# Patient Record
Sex: Female | Born: 1995 | Race: White | Hispanic: No | Marital: Single | Smoking: Former smoker
Health system: Southern US, Community
[De-identification: ages and names within clinical notes are randomized; demographics above are authoritative.]

## PROBLEM LIST (undated history)

## (undated) DIAGNOSIS — N200 Calculus of kidney: Secondary | ICD-10-CM

## (undated) DIAGNOSIS — J45909 Unspecified asthma, uncomplicated: Secondary | ICD-10-CM

## (undated) DIAGNOSIS — D649 Anemia, unspecified: Secondary | ICD-10-CM

## (undated) DIAGNOSIS — F419 Anxiety disorder, unspecified: Secondary | ICD-10-CM

## (undated) DIAGNOSIS — R001 Bradycardia, unspecified: Secondary | ICD-10-CM

## (undated) HISTORY — DX: Unspecified asthma, uncomplicated: J45.909

## (undated) HISTORY — DX: Bradycardia, unspecified: R00.1

## (undated) HISTORY — DX: Anemia, unspecified: D64.9

## (undated) HISTORY — DX: Calculus of kidney: N20.0

## (undated) HISTORY — PX: URETERAL STENT PLACEMENT: SHX822

---

## 2018-09-15 HISTORY — PX: TUBAL LIGATION: SHX77

## 2019-09-11 ENCOUNTER — Emergency Department (HOSPITAL_COMMUNITY): Payer: Medicaid Other

## 2019-09-11 ENCOUNTER — Emergency Department (HOSPITAL_COMMUNITY)
Admission: EM | Admit: 2019-09-11 | Discharge: 2019-09-11 | Disposition: A | Discharge status: Home / Self Care | Payer: Medicaid Other | Attending: Emergency Medicine | Admitting: Emergency Medicine

## 2019-09-11 ENCOUNTER — Encounter (HOSPITAL_COMMUNITY): Payer: Self-pay | Admitting: Emergency Medicine

## 2019-09-11 ENCOUNTER — Other Ambulatory Visit: Payer: Self-pay

## 2019-09-11 DIAGNOSIS — R197 Diarrhea, unspecified: Secondary | ICD-10-CM | POA: Insufficient documentation

## 2019-09-11 DIAGNOSIS — R112 Nausea with vomiting, unspecified: Secondary | ICD-10-CM | POA: Diagnosis not present

## 2019-09-11 DIAGNOSIS — R102 Pelvic and perineal pain: Secondary | ICD-10-CM

## 2019-09-11 DIAGNOSIS — R195 Other fecal abnormalities: Secondary | ICD-10-CM | POA: Diagnosis not present

## 2019-09-11 DIAGNOSIS — R1084 Generalized abdominal pain: Secondary | ICD-10-CM | POA: Diagnosis present

## 2019-09-11 HISTORY — DX: Anxiety disorder, unspecified: F41.9

## 2019-09-11 LAB — CBC WITH DIFFERENTIAL/PLATELET
Abs Immature Granulocytes: 0.08 10*3/uL — ABNORMAL HIGH (ref 0.00–0.07)
Basophils Absolute: 0 10*3/uL (ref 0.0–0.1)
Basophils Relative: 0 %
Eosinophils Absolute: 0 10*3/uL (ref 0.0–0.5)
Eosinophils Relative: 0 %
HCT: 36 % (ref 36.0–46.0)
Hemoglobin: 11.1 g/dL — ABNORMAL LOW (ref 12.0–15.0)
Immature Granulocytes: 1 %
Lymphocytes Relative: 5 %
Lymphs Abs: 0.7 10*3/uL (ref 0.7–4.0)
MCH: 24 pg — ABNORMAL LOW (ref 26.0–34.0)
MCHC: 30.8 g/dL (ref 30.0–36.0)
MCV: 77.9 fL — ABNORMAL LOW (ref 80.0–100.0)
Monocytes Absolute: 0.5 10*3/uL (ref 0.1–1.0)
Monocytes Relative: 3 %
Neutro Abs: 13 10*3/uL — ABNORMAL HIGH (ref 1.7–7.7)
Neutrophils Relative %: 91 %
Platelets: 551 10*3/uL — ABNORMAL HIGH (ref 150–400)
RBC: 4.62 MIL/uL (ref 3.87–5.11)
RDW: 16.7 % — ABNORMAL HIGH (ref 11.5–15.5)
WBC: 14.2 10*3/uL — ABNORMAL HIGH (ref 4.0–10.5)
nRBC: 0 % (ref 0.0–0.2)

## 2019-09-11 LAB — COMPREHENSIVE METABOLIC PANEL
ALT: 15 U/L (ref 0–44)
AST: 18 U/L (ref 15–41)
Albumin: 4 g/dL (ref 3.5–5.0)
Alkaline Phosphatase: 82 U/L (ref 38–126)
Anion gap: 12 (ref 5–15)
BUN: 10 mg/dL (ref 6–20)
CO2: 20 mmol/L — ABNORMAL LOW (ref 22–32)
Calcium: 8.6 mg/dL — ABNORMAL LOW (ref 8.9–10.3)
Chloride: 109 mmol/L (ref 98–111)
Creatinine, Ser: 0.89 mg/dL (ref 0.44–1.00)
GFR calc Af Amer: >60 mL/min (ref >60)
GFR calc non Af Amer: >60 mL/min (ref >60)
Glucose, Bld: 114 mg/dL — ABNORMAL HIGH (ref 70–99)
Potassium: 3.8 mmol/L (ref 3.5–5.1)
Sodium: 141 mmol/L (ref 135–145)
Total Bilirubin: 0.6 mg/dL (ref 0.3–1.2)
Total Protein: 7.4 g/dL (ref 6.5–8.1)

## 2019-09-11 LAB — URINALYSIS, ROUTINE W REFLEX MICROSCOPIC
Bilirubin Urine: NEGATIVE
Glucose, UA: NEGATIVE mg/dL
Ketones, ur: 20 mg/dL — AB
Leukocytes,Ua: NEGATIVE
Nitrite: NEGATIVE
Protein, ur: 30 mg/dL — AB
Specific Gravity, Urine: >1.046 — ABNORMAL HIGH (ref 1.005–1.030)
pH: 7 (ref 5.0–8.0)

## 2019-09-11 LAB — LIPASE, BLOOD: Lipase: 16 U/L (ref 11–51)

## 2019-09-11 LAB — POC OCCULT BLOOD, ED: Fecal Occult Bld: POSITIVE — AB

## 2019-09-11 LAB — I-STAT BETA HCG BLOOD, ED (MC, WL, AP ONLY): I-stat hCG, quantitative: <5 m[IU]/mL (ref <5)

## 2019-09-11 MED ORDER — IOHEXOL 300 MG/ML  SOLN
100.0000 mL | Freq: Once | INTRAMUSCULAR | Status: AC | PRN
  Administered 2019-09-11: 100 mL via INTRAVENOUS

## 2019-09-11 MED ORDER — ONDANSETRON HCL 4 MG/2ML IJ SOLN
4.0000 mg | Freq: Once | INTRAMUSCULAR | Status: AC
  Administered 2019-09-11: 4 mg via INTRAVENOUS
  Filled 2019-09-11: qty 2

## 2019-09-11 MED ORDER — PANTOPRAZOLE SODIUM 40 MG IV SOLR
40.0000 mg | Freq: Once | INTRAVENOUS | Status: AC
  Administered 2019-09-11: 40 mg via INTRAVENOUS
  Filled 2019-09-11: qty 40

## 2019-09-11 MED ORDER — ONDANSETRON 4 MG PO TBDP: 4.0000 mg | ORAL_TABLET | Freq: Three times a day (TID) | ORAL | 0 refills | Status: AC | PRN

## 2019-09-11 MED ORDER — MORPHINE SULFATE (PF) 4 MG/ML IV SOLN
4.0000 mg | Freq: Once | INTRAVENOUS | Status: AC
  Administered 2019-09-11: 4 mg via INTRAVENOUS
  Filled 2019-09-11: qty 1

## 2019-09-11 MED ORDER — SODIUM CHLORIDE 0.9 % IV BOLUS
1000.0000 mL | Freq: Once | INTRAVENOUS | Status: AC
  Administered 2019-09-11: 1000 mL via INTRAVENOUS

## 2019-09-11 NOTE — ED Provider Notes (Addendum)
[facility] EMERGENCY DEPARTMENT Provider Note   CSN: [id number] Arrival date & time: 09/11/19  1238   History Chief Complaint  Patient presents with  . Abdominal Pain   Ashley Sexton is a 24 y.o. female with past medical history significant for anemia, tubal ligation who presents for evaluation of abdominal pain and emesis.  Patient has had abdominal pain x2 days.  Described aching, sharp, stabbing.  Worse with any movement.  She has had multiple episodes of NBNB emesis.  States she has had dark and tarry stools over the last 2 days which is alternating constipation and diarrhea.  No prior history of GI bleed.  States she is very anxious.  Denies any alcohol or NSAID use.  Denies fever, chills, chest pain, shortness of breath, dysuria, chance of pregnancy, pelvic pain, vaginal discharge.  Denies additional aggravating or alleviating factors. No anticoagulation. Has never seen GI previously  History obtained from patient and past medical records. No interpretor was used.  HPI     Past Medical History:  Diagnosis Date  . Anxiety     There are no problems to display for this patient.   History reviewed   OB History   No obstetric history on file.     History reviewed. No pertinent family history.  Social History   Tobacco Use  . Smoking status: Unknown If Ever Smoked  Substance Use Topics  . Alcohol use: Yes  . Drug use: Yes    Frequency: 1.0 times per week    Types: Marijuana    Home Medications Prior to Admission medications   Not on File    Allergies    Patient has no known allergies.  Review of Systems   Review of Systems  Constitutional: Negative.   HENT: Negative.   Respiratory: Negative.   Cardiovascular: Negative.   Gastrointestinal: Positive for abdominal pain, blood in stool, constipation, diarrhea, nausea and vomiting. Negative for abdominal distention, anal bleeding and rectal pain.  Genitourinary: Negative.   Musculoskeletal:  Negative.   Skin: Negative.   Neurological: Negative.   All other systems reviewed and are negative.   Physical Exam Updated Vital Signs BP (!) 102/58   Pulse (!) 47   Temp 97.6 F (36.4 C) (Oral)   Resp 19   SpO2 95%   Physical Exam Vitals and nursing note reviewed. Exam conducted with a chaperone present.  Constitutional:      General: She is in acute distress.     Appearance: She is well-developed. She is not ill-appearing or toxic-appearing.  HENT:     Head: Normocephalic and atraumatic.  Eyes:     Pupils: Pupils are equal, round, and reactive to light.  Cardiovascular:     Rate and Rhythm: Tachycardia present.     Heart sounds: Normal heart sounds.  Pulmonary:     Effort: Pulmonary effort is normal. No respiratory distress.     Breath sounds: Normal breath sounds.     Comments: Speaks in full sentences without difficulty Abdominal:     General: Bowel sounds are normal. There is no distension.     Palpations: Abdomen is soft.     Tenderness: There is generalized abdominal tenderness. There is guarding. There is no right CVA tenderness, left CVA tenderness or rebound.     Hernia: No hernia is present.     Comments: Diffuse tenderness with guarding. No rebound.  Genitourinary:    Rectum: Guaiac result positive. No mass, tenderness, anal fissure or external hemorrhoid. Normal  anal tone.     Comments: No stool in rectal vault, old hemorrhoid skin tag.  No gross bright red blood per rectum or melena Musculoskeletal:        General: Normal range of motion.     Cervical back: Normal range of motion.     Comments: Moves all 4 extremities without difficulty  Skin:    General: Skin is warm and dry.     Capillary Refill: Capillary refill takes less than 2 seconds.     Comments: Mild pallor  Neurological:     Mental Status: She is alert.     ED Results / Procedures / Treatments   Labs (all labs ordered are listed, but only abnormal results are displayed) Labs Reviewed    CBC WITH DIFFERENTIAL/PLATELET - Abnormal; Notable for the following components:      Result Value   WBC 14.2 (*)    Hemoglobin 11.1 (*)    MCV 77.9 (*)    MCH 24.0 (*)    RDW 16.7 (*)    Platelets 551 (*)    Neutro Abs 13.0 (*)    Abs Immature Granulocytes 0.08 (*)    All other components within normal limits  COMPREHENSIVE METABOLIC PANEL - Abnormal; Notable for the following components:   CO2 20 (*)    Glucose, Bld 114 (*)    Calcium 8.6 (*)    All other components within normal limits  POC OCCULT BLOOD, ED - Abnormal; Notable for the following components:   Fecal Occult Bld POSITIVE (*)    All other components within normal limits  LIPASE, BLOOD  URINALYSIS, ROUTINE W REFLEX MICROSCOPIC  I-STAT BETA HCG BLOOD, ED ([facility], [facility], [facility] ONLY)    EKG EKG Interpretation  Date/Time:  Thursday September 11 2019 12:44:35 EST Ventricular Rate:  119 PR Interval:    QRS Duration: 92 QT Interval:  335 QTC Calculation: 472 R Axis:   81 Text Interpretation: Sinus tachycardia Borderline Q waves in inferior leads Baseline wander in lead(s) V2 no prior Confirmed by Eber Hong ([id number]) on 09/11/2019 12:47:13 PM   Radiology CT Abdomen Pelvis W Contrast  Result Date: 09/11/2019 CLINICAL DATA:  Generalized acute mid abdominal pain. Neutropenia. EXAM: CT ABDOMEN AND PELVIS WITH CONTRAST TECHNIQUE: Multidetector CT imaging of the abdomen and pelvis was performed using the standard protocol following bolus administration of intravenous contrast. CONTRAST:  OMNIPAQUE IOHEXOL 300 MG/ML  SOLN COMPARISON:  07/13/2019 CT abdomen/pelvis. FINDINGS: Lower chest: Mild patchy ground-glass opacities at right greater than left lung bases. Hepatobiliary: Persistent mild hepatomegaly, unchanged. No liver masses. No definite liver surface irregularity. Normal gallbladder with no radiopaque cholelithiasis. No biliary ductal dilatation. Pancreas: Normal, with no mass or duct dilation. Spleen: Normal size. No mass.  Adrenals/Urinary Tract: Normal adrenals. Normal kidneys with no hydronephrosis and no renal mass. Normal bladder. Stomach/Bowel: Normal non-distended stomach. Collapsed small bowel loops. No dilated small bowel loops. No small bowel wall thickening. Normal appendix. Collapsed large bowel. Apparent mild thickening of the ascending colonic wall. Otherwise normal large bowel wall thickness with no colonic diverticulosis. Vascular/Lymphatic: Normal caliber abdominal aorta. Patent portal, splenic, hepatic and renal veins. No pathologically enlarged lymph nodes in the abdomen or pelvis. Reproductive: Grossly normal uterus. Bilateral tubal ligation clips are in place. No adnexal mass. Other: No pneumoperitoneum, ascites or focal fluid collection. Musculoskeletal: No aggressive appearing focal osseous lesions. Mild degenerative disc disease in the lower thoracic spine. IMPRESSION: 1. Apparent mild thickening of the ascending colonic wall, which could be  due to underdistention, with a nonspecific mild colitis or noninflammatory edema such as due to hypoproteinemia not excluded. No evidence of bowel obstruction. Normal appendix. 2. Nonspecific mild hepatomegaly is unchanged since recent CT. Normal size spleen. No abdominopelvic adenopathy. 3. Mild patchy ground-glass opacities at the right greater than left lung bases, with a broad differential, probably inflammatory/atypical pneumonia. Electronically Signed   By: Delbert Phenix M.D.   On: 09/11/2019 14:53    Procedures Procedures (including critical care time)  Medications Ordered in ED Medications  pantoprazole (PROTONIX) injection 40 mg (40 mg Intravenous Given 09/11/19 1308)  sodium chloride 0.9 % bolus 1,000 mL (1,000 mLs Intravenous New Bag/Given 09/11/19 1307)  morphine 4 MG/ML injection 4 mg (4 mg Intravenous Given 09/11/19 1307)  iohexol (OMNIPAQUE) 300 MG/ML solution 100 mL (100 mLs Intravenous Contrast Given 09/11/19 1440)  morphine 4 MG/ML injection 4 mg (4  mg Intravenous Given 09/11/19 1506)  ondansetron (ZOFRAN) injection 4 mg (4 mg Intravenous Given 09/11/19 1506)    ED Course  I have reviewed the triage vital signs and the nursing notes.  Pertinent labs & imaging results that were available during my care of the patient were reviewed by me and considered in my medical decision making (see chart for details).  24 year old female presents for evaluation abdominal pain, emesis and dark stools.  Symptoms x2 days.  She is afebrile, nonseptic appearing.  On exam patient with diffuse tenderness with light palpation to all 4 quadrants.  NBNB emesis.  No stool in rectal vault on GU exam however occult positive.  She has no alcohol, NSAID or anticoagulation use. No UR sx, CP, SOB. Low suspicion for atypical PE. Plan for pain management, fluids, labs, CT AP.  Labs and imaging personally reviewed: Occult Positive however BUN WNL low suspicion for UGI bleed. CBC with leukocytosis at 14.2, Hgb 11.1 no prior to compare CMP mild hyperglycemia to 114, normal BUN.  Noted electrolyte, renal or liver normality Pregnancy negative Lipase 16  UA pending EKG Sinus tach  CTAP with mild thickening of the ascending wall could be due to underdistention versus nonspecific mild colitis versus noninflammatory edema.  Patient reassessed. Now pain to RLQ. Patient denies concerns for STD and does not want GU exam. She did agree to [place] to r/o torsion.  Vital signs with improvement.  No tachycardia.  Stable blood pressure without any hypotension.  Care transferred to Dr. Durene Cal will follow up on remaining imaging and reassess pain. He will determine ultimate treatment, plan and disposition.    MDM Rules/Calculators/A&P                       Final Clinical Impression(s) / ED Diagnoses Final diagnoses:  Generalized abdominal pain  Nausea vomiting and diarrhea  Occult blood positive stool    Rx / DC Orders ED Discharge Orders    None       Wylee Dorantes A,  PA-C 09/11/19 1528    Attilio Zeitler A, PA-C 09/11/19 1529    Eber Hong, MD 09/13/19 1625

## 2019-09-11 NOTE — ED Notes (Signed)
Patient transported to US 

## 2019-09-11 NOTE — ED Notes (Signed)
Patient transported to CT 

## 2019-09-11 NOTE — ED Triage Notes (Addendum)
From UC. Pt had intense abd pain starting 2 days ago. Black tarry stools starting same time. Tender all quadrants. Endorses n/v.  given in route 0.5 atropine 4mg  zofran 100mg  fentanyl NS Hx full hysterectomy, anxiety, anemia

## 2019-09-11 NOTE — ED Notes (Signed)
Got patient undress into a gown on the monitor did ekg shown to Dr Hyacinth Meeker patient is resting with call bell in reach and nurse at bedside

## 2019-09-11 NOTE — ED Provider Notes (Signed)
Medical Decision Making: Care of patient assumed from Britni Henderly PA-C at 1500.  Agree with history, physical exam and plan.  See their note for further details.  Briefly, 24 y.o. female with PMH/PSH as below.  Past Medical History:  Diagnosis Date  . Anxiety    History reviewed. No pertinent surgical history.   Patient HDS at handoff.    Plan at time of handoff:  2 days of diffuse abd pain.  Getting pelvic US.  Recheck after [place], plan for home with zofran if everything good.  ED Course No evidence of torsion on pelvic US.  Rechecked the pt, c/o nausea.  Reports she has been having this pain for several weeks and has been seen in the ED 4 times recently for the same.  Will give GI for follow up.    Strict return precautions given.  Discharged home in stable condition.   Clinical Impression 1. Generalized abdominal pain   2. Nausea vomiting and diarrhea   3. Occult blood positive stool   4. Pelvic pain    Patient care provided under supervision of my attending, Dr. Rhunette Croft.    Clinton Dragone, [place], MD 09/12/19 0140    Derwood Kaplan, MD 09/17/19 [phone]

## 2019-09-19 ENCOUNTER — Telehealth: Payer: Self-pay | Admitting: *Deleted

## 2019-09-19 NOTE — Telephone Encounter (Signed)
PT called regarding GI not receiving referral information.  RNCM  contacted GI office to get fax number and faxed information.

## 2019-10-21 ENCOUNTER — Encounter: Payer: Self-pay | Admitting: Cardiology

## 2019-10-21 ENCOUNTER — Other Ambulatory Visit: Payer: Self-pay

## 2019-10-21 ENCOUNTER — Ambulatory Visit: Payer: Medicaid Other

## 2019-10-21 ENCOUNTER — Ambulatory Visit (INDEPENDENT_AMBULATORY_CARE_PROVIDER_SITE_OTHER): Payer: Medicaid Other | Admitting: Cardiology

## 2019-10-21 VITALS — BP 104/70 | HR 62 | Ht 68.0 in | Wt 127.0 lb

## 2019-10-21 DIAGNOSIS — R002 Palpitations: Secondary | ICD-10-CM

## 2019-10-21 DIAGNOSIS — E538 Deficiency of other specified B group vitamins: Secondary | ICD-10-CM | POA: Diagnosis not present

## 2019-10-21 DIAGNOSIS — R5383 Other fatigue: Secondary | ICD-10-CM | POA: Diagnosis not present

## 2019-10-21 DIAGNOSIS — R001 Bradycardia, unspecified: Secondary | ICD-10-CM

## 2019-10-21 DIAGNOSIS — R55 Syncope and collapse: Secondary | ICD-10-CM | POA: Insufficient documentation

## 2019-10-21 DIAGNOSIS — F419 Anxiety disorder, unspecified: Secondary | ICD-10-CM

## 2019-10-21 DIAGNOSIS — J45909 Unspecified asthma, uncomplicated: Secondary | ICD-10-CM | POA: Insufficient documentation

## 2019-10-21 DIAGNOSIS — D649 Anemia, unspecified: Secondary | ICD-10-CM | POA: Insufficient documentation

## 2019-10-21 NOTE — Patient Instructions (Signed)
Medication Instructions:  Your physician recommends that you continue on your current medications as directed. Please refer to the Current Medication list given to you today.  *If you need a refill on your cardiac medications before your next appointment, please call your pharmacy*  Lab Work: If you have labs (blood work) drawn today and your tests are completely normal, you will receive your results only by: Marland Kitchen MyChart Message (if you have MyChart) OR . A paper copy in the mail If you have any lab test that is abnormal or we need to change your treatment, we will call you to review the results.  Testing/Procedures: Your physician has requested that you have an echocardiogram. Echocardiography is a painless test that uses sound waves to create images of your heart. It provides your doctor with information about the size and shape of your heart and how well your heart's chambers and valves are working. This procedure takes approximately one hour. There are no restrictions for this procedure.  ZIO XT- Long Term Monitor Instructions   Your physician has requested you wear your ZIO patch monitor___14___days.   This is a single patch monitor.  [employer] supplies one patch monitor per enrollment.  Additional stickers are not available.   Please do not apply patch if you will be having a Nuclear Stress Test, Echocardiogram, Cardiac CT, MRI, or Chest Xray during the time frame you would be wearing the monitor. The patch cannot be worn during these tests.  You cannot remove and re-apply the ZIO XT patch monitor.   Your ZIO patch monitor will be sent USPS Priority mail from [facility] directly to your home address. The monitor may also be mailed to a PO BOX if home delivery is not available.   It may take 3-5 days to receive your monitor after you have been enrolled.   Once you have received you monitor, please review enclosed instructions.  Your monitor has already been registered assigning a  specific monitor serial # to you.   Applying the monitor   Shave hair from upper left chest.   Hold abrader disc by orange tab.  Rub abrader in 40 strokes over left upper chest as indicated in your monitor instructions.   Clean area with 4 enclosed alcohol pads .  Use all pads to assure are is cleaned thoroughly.  Let dry.   Apply patch as indicated in monitor instructions.  Patch will be place under collarbone on left side of chest with arrow pointing upward.   Rub patch adhesive wings for 2 minutes.Remove white label marked "1".  Remove white label marked "2".  Rub patch adhesive wings for 2 additional minutes.   While looking in a mirror, press and release button in center of patch.  A small green light will flash 3-4 times .  This will be your only indicator the monitor has been turned on.     Do not shower for the first 24 hours.  You may shower after the first 24 hours.   Press button if you feel a symptom. You will hear a small click.  Record Date, Time and Symptom in the Patient Log Book.   When you are ready to remove patch, follow instructions on last 2 pages of Patient Log Book.  Stick patch monitor onto last page of Patient Log Book.   Place Patient Log Book in [place] box.  Use locking tab on box and tape box closed securely.  The [place] and [employer] has [employer]  on it.  Please place in mailbox as soon as possible.  Your physician should have your test results approximately 7 days after the monitor has been mailed back to [facility].   Call [facility] Customer Care at [phone] if you have questions regarding your ZIO XT patch monitor.  Call them immediately if you see an orange light blinking on your monitor.   If your monitor falls off in less than 4 days contact our Monitor department at [phone].  If your monitor becomes loose or falls off after 4 days call [employer] at [phone] for suggestions on securing your monitor.   Follow-Up: At [facility], you and your health needs are our priority.  As part of our continuing mission to provide you with exceptional heart care, we have created designated Provider Care Teams.  These Care Teams include your primary Cardiologist (physician) and Advanced Practice Providers (APPs -  Physician Assistants and Nurse Practitioners) who all work together to provide you with the care you need, when you need it.  We recommend signing up for the patient portal called "MyChart".  Sign up information is provided on this After Visit Summary.  MyChart is used to connect with patients for Virtual Visits (Telemedicine).  Patients are able to view lab/test results, encounter notes, upcoming appointments, etc.  Non-urgent messages can be sent to your provider as well.   To learn more about what you can do with MyChart, go to [email or web address].    Your next appointment:   3 month(s)  The format for your next appointment:   Either In Person or Virtual  Provider:   You will see Kardie Tobb, DO.

## 2019-10-21 NOTE — Progress Notes (Signed)
Cardiology Office Note:    Date:  10/21/2019   ID:  Ashley Sexton, DOB 1995/08/01, MRN [id number]  PCP:  Patient, No Pcp Per  Cardiologist:  Berniece Salines, DO  Electrophysiologist:  None   Referring MD: Imagene Riches, NP   Chief Complaint  Patient presents with  . Palpitations    History of Present Illness:    Ashley Sexton is a 24 y.o. female with a hx of asthma, anxiety, unintentional 60 pound weight loss over the last 9 months presents today to be evaluated for palpitations.  The patient tells me that she has been experiencing significant fatigue over the last few months with loss of appetite.  She states that this is absolutely not like her as she does not have a eating disorder and she likes to eat.  There has been concern therefore patient reports that she has had multiple testing which she has been told has all been negative.  She has had multiple ED visits she was seen in December 2020 at the [facility] emergency department where she presented with nausea and vomiting as well as a single episode of syncope.  At that time it was noted that she did have some significant red blood in her vomitus along with visible bile.  Upon evaluation it was noted that her syncope episodes were vasovagal all of her labs were unremarkable however her urine drug screen was positive for opioids, cocaine and marijuana.  At that time her CT scan was abnormal concerning for COVID-19 virus as well as hepatomegaly with diffuse periportal edema.  She was discharged home with doxycycline and Zofran.  On February 25 she was seen in the emergency department at [facility] where she presented with abdominal pain, nausea, vomiting and diarrhea.  She was also guaiac positive at that time.  Her CTA at that time showed mildly thickened ascending wall which could be due to underdistention versus nonspecific mild colitis versus noninflammatory edema.   Past Medical History:  Diagnosis Date  . Anemia   .  Anxiety   . Asthma   . Bradycardia   . Kidney stone     Past Surgical History:  Procedure Laterality Date  . CESAREAN SECTION     X2  . TUBAL LIGATION  09/2018  . URETERAL STENT PLACEMENT     For kidney stone    Current Medications: Current Meds  Medication Sig  . ondansetron (ZOFRAN ODT) 4 MG disintegrating tablet Take 1 tablet (4 mg total) by mouth every 8 (eight) hours as needed for nausea or vomiting.     Allergies:   Patient has no known allergies.   Social History   Socioeconomic History  . Marital status: Single    Spouse name: Not on file  . Number of children: Not on file  . Years of education: Not on file  . Highest education level: Not on file  Occupational History  . Not on file  Tobacco Use  . Smoking status: Former Smoker    Types: Cigarettes  . Smokeless tobacco: Never Used  Substance and Sexual Activity  . Alcohol use: Not Currently  . Drug use: Yes    Frequency: 1.0 times per week    Types: Marijuana  . Sexual activity: Yes  Other Topics Concern  . Not on file  Social History Narrative  . Not on file   Social Determinants of Health   Financial Resource Strain:   . Difficulty of Paying Living Expenses:   Food Insecurity:   .  Worried About Charity fundraiser in the Last Year:   . Arboriculturist in the Last Year:   Transportation Needs:   . Film/video editor (Medical):   Marland Kitchen Lack of Transportation (Non-Medical):   Physical Activity:   . Days of Exercise per Week:   . Minutes of Exercise per Session:   Stress:   . Feeling of Stress :   Social Connections:   . Frequency of Communication with Friends and Family:   . Frequency of Social Gatherings with Friends and Family:   . Attends Religious Services:   . Active Member of Clubs or Organizations:   . Attends Archivist Meetings:   Marland Kitchen Marital Status:      Family History: The patient's family history includes Heart disease in her father and mother; Hypercholesterolemia in  her father and mother; Hypertension in her father and mother.  ROS:   Review of Systems  Constitution: Negative for decreased appetite, fever and weight gain.  HENT: Negative for congestion, ear discharge, hoarse voice and sore throat.   Eyes: Negative for discharge, redness, vision loss in right eye and visual halos.  Cardiovascular: Negative for chest pain, dyspnea on exertion, leg swelling, orthopnea and palpitations.  Respiratory: Negative for cough, hemoptysis, shortness of breath and snoring.   Endocrine: Negative for heat intolerance and polyphagia.  Hematologic/Lymphatic: Negative for bleeding problem. Does not bruise/bleed easily.  Skin: Negative for flushing, nail changes, rash and suspicious lesions.  Musculoskeletal: Negative for arthritis, joint pain, muscle cramps, myalgias, neck pain and stiffness.  Gastrointestinal: Negative for abdominal pain, bowel incontinence, diarrhea and excessive appetite.  Genitourinary: Negative for decreased libido, genital sores and incomplete emptying.  Neurological: Negative for brief paralysis, focal weakness, headaches and loss of balance.  Psychiatric/Behavioral: Negative for altered mental status, depression and suicidal ideas.  Allergic/Immunologic: Negative for HIV exposure and persistent infections.    EKGs/Labs/Other Studies Reviewed:    The following studies were reviewed today:   EKG:  The ekg ordered today demonstrates sinus rhythm, HR 72 bpm with arrhythmia.  CTAP 09/11/2019 COMPARISON:  07/13/2019 CT abdomen/pelvis.  FINDINGS: Lower chest: Mild patchy ground-glass opacities at right greater than left lung bases.  Hepatobiliary: Persistent mild hepatomegaly, unchanged. No liver masses. No definite liver surface irregularity. Normal gallbladder with no radiopaque cholelithiasis. No biliary ductal dilatation.  Pancreas: Normal, with no mass or duct dilation.  Spleen: Normal size. No mass.  Adrenals/Urinary Tract:  Normal adrenals. Normal kidneys with no hydronephrosis and no renal mass. Normal bladder.  Stomach/Bowel: Normal non-distended stomach. Collapsed small bowel loops. No dilated small bowel loops. No small bowel wall thickening. Normal appendix. Collapsed large bowel. Apparent mild thickening of the ascending colonic wall. Otherwise normal large bowel wall thickness with no colonic diverticulosis.  Vascular/Lymphatic: Normal caliber abdominal aorta. Patent portal, splenic, hepatic and renal veins. No pathologically enlarged lymph nodes in the abdomen or pelvis.  Reproductive: Grossly normal uterus. Bilateral tubal ligation clips are in place. No adnexal mass.  Other: No pneumoperitoneum, ascites or focal fluid collection.  Musculoskeletal: No aggressive appearing focal osseous lesions. Mild degenerative disc disease in the lower thoracic spine.  IMPRESSION: 1. Apparent mild thickening of the ascending colonic wall, which could be due to underdistention, with a nonspecific mild colitis or noninflammatory edema such as due to hypoproteinemia not excluded. No evidence of bowel obstruction. Normal appendix. 2. Nonspecific mild hepatomegaly is unchanged since recent CT. Normal size spleen. No abdominopelvic adenopathy. 3. Mild patchy ground-glass opacities at the  right greater than left lung bases, with a broad differential, probably inflammatory/atypical pneumonia.  CT done of the abdomen and pelvics July 13 2019: Hepatomegaly with diffuse periportal edema.  This could be secondary to underlying liver disease or hepatitis.  Correlation with liver function studies recommended.  Possible mild diffuse colonic wall thickening, although this could be due to incomplete distention.  No evidence of bowel obstruction or perforation. Patchy groundglass opacity in both lung bases, worse on the right likely inflammatory or infectious.  Consider viral infection including COVID-19.   Bilateral tubal ligation clips with small collapsing right ovarian follicle.  Recent Labs: 09/11/2019: ALT 15; BUN 10; Creatinine, Ser 0.89; Hemoglobin 11.1; Platelets 551; Potassium 3.8; Sodium 141  Blood work from 09/30/2019 from PCP office: Sodium 137, potassium 4.1, chloride 106, bicarb 26, BUN 9, glucose 78, creatinine 0.52, calcium 9.5, total protein 7.3, total bili 0.4, alk phos 72, AST 12, ALT 13, anion gap 6, 46.1, Iron panel: Transferrin 251, iron 12, ferritin 13, TIBC 321, U IBC 309, percent iron saturation 4 Lipase 27 TSH 1.17, uric acid 3.6 Vitamin B12 122, rheumatoid factor negative CBC: WBC 12.4, hemoglobin 11.2, hematocrit 36.0, platelet 469 Reticulocyte count 0.9 Absolute reticulocyte 0.043  Recent Lipid Panel No results found for: CHOL, TRIG, HDL, CHOLHDL, VLDL, LDLCALC, LDLDIRECT  Physical Exam:    VS:  BP 104/70 (BP Location: Right Arm, Patient Position: Sitting, Cuff Size: Normal)   Pulse 62   Ht 5' 8"  (1.727 m)   Wt 127 lb (57.6 kg)   SpO2 97%   BMI 19.31 kg/m     Wt Readings from Last 3 Encounters:  10/21/19 127 lb (57.6 kg)  09/30/19 127 lb (57.6 kg)     GEN: Well nourished, well developed in no acute distress HEENT: Normal NECK: No JVD; No carotid bruits LYMPHATICS: No lymphadenopathy CARDIAC: S1S2 noted,RRR, no murmurs, rubs, gallops RESPIRATORY:  Clear to auscultation without rales, wheezing or rhonchi  ABDOMEN: Soft, non-tender, non-distended, +bowel sounds, no guarding. EXTREMITIES: No edema, No cyanosis, no clubbing MUSCULOSKELETAL:  No deformity  SKIN: Warm and dry NEUROLOGIC:  Alert and oriented x 3, non-focal PSYCHIATRIC:  Normal affect, good insight  ASSESSMENT:    1. Palpitations   2. Vitamin B 12 deficiency   3. Fatigue, unspecified type   4. Syncope and collapse    PLAN:     1.  I would like to rule out a cardiovascular etiology of this palpitation, therefore at this time I would like to placed a zio patch for 14 days. In  additon a transthoracic echocardiogram will be ordered to assess LV/RV function and any structural abnormalities. Once these testing have been performed amd reviewed further reccomendations will be made. For now, I do reccomend that the patient goes to the nearest ED if  symptoms recur.  2.  In terms of her syncope they were thought to be vasovagal.  However work-up as stated above.  3.  Fatigue likely in the setting of a vitamin B12 deficiency, vitamin B12 122 on her labs.  She is tells me that she has been receiving the vitamin B12 shots and this is improving.  The patient is in agreement with the above plan. The patient left the office in stable condition.  The patient will follow up in 3 months or sooner if needed.   Medication Adjustments/Labs and Tests Ordered: Current medicines are reviewed at length with the patient today.  Concerns regarding medicines are outlined above.  Orders Placed This Encounter  Procedures  . LONG TERM MONITOR (3-14 DAYS)  . EKG 12-Lead  . ECHOCARDIOGRAM COMPLETE   No orders of the defined types were placed in this encounter.   Patient Instructions  Medication Instructions:  Your physician recommends that you continue on your current medications as directed. Please refer to the Current Medication list given to you today.  *If you need a refill on your cardiac medications before your next appointment, please call your pharmacy*  Lab Work: If you have labs (blood work) drawn today and your tests are completely normal, you will receive your results only by: Marland Kitchen MyChart Message (if you have MyChart) OR . A paper copy in the mail If you have any lab test that is abnormal or we need to change your treatment, we will call you to review the results.  Testing/Procedures: Your physician has requested that you have an echocardiogram. Echocardiography is a painless test that uses sound waves to create images of your heart. It provides your doctor with information  about the size and shape of your heart and how well your heart's chambers and valves are working. This procedure takes approximately one hour. There are no restrictions for this procedure.  ZIO XT- Long Term Monitor Instructions   Your physician has requested you wear your ZIO patch monitor___14___days.   This is a single patch monitor.  [employer] supplies one patch monitor per enrollment.  Additional stickers are not available.   Please do not apply patch if you will be having a Nuclear Stress Test, Echocardiogram, Cardiac CT, MRI, or Chest Xray during the time frame you would be wearing the monitor. The patch cannot be worn during these tests.  You cannot remove and re-apply the ZIO XT patch monitor.   Your ZIO patch monitor will be sent USPS Priority mail from [facility] directly to your home address. The monitor may also be mailed to a PO BOX if home delivery is not available.   It may take 3-5 days to receive your monitor after you have been enrolled.   Once you have received you monitor, please review enclosed instructions.  Your monitor has already been registered assigning a specific monitor serial # to you.   Applying the monitor   Shave hair from upper left chest.   Hold abrader disc by orange tab.  Rub abrader in 40 strokes over left upper chest as indicated in your monitor instructions.   Clean area with 4 enclosed alcohol pads .  Use all pads to assure are is cleaned thoroughly.  Let dry.   Apply patch as indicated in monitor instructions.  Patch will be place under collarbone on left side of chest with arrow pointing upward.   Rub patch adhesive wings for 2 minutes.Remove white label marked "1".  Remove white label marked "2".  Rub patch adhesive wings for 2 additional minutes.   While looking in a mirror, press and release button in center of patch.  A small green light will flash 3-4 times .  This will be your only indicator the monitor has been turned on.     Do  not shower for the first 24 hours.  You may shower after the first 24 hours.   Press button if you feel a symptom. You will hear a small click.  Record Date, Time and Symptom in the Patient Log Book.   When you are ready to remove patch, follow instructions on last 2 pages of Patient Log Book.  Stick patch monitor onto  last page of Patient Log Book.   Place Patient Log Book in [place] box.  Use locking tab on box and tape box closed securely.  The [employer] and [employer] has [employer] on it.  Please place in mailbox as soon as possible.  Your physician should have your test results approximately 7 days after the monitor has been mailed back to [facility].   Call [facility] at [phone] if you have questions regarding your ZIO XT patch monitor.  Call them immediately if you see an orange light blinking on your monitor.   If your monitor falls off in less than 4 days contact our Monitor department at [phone].  If your monitor becomes loose or falls off after 4 days call [employer] at [phone] for suggestions on securing your monitor.   Follow-Up: At [facility], you and your health needs are our priority.  As part of our continuing mission to provide you with exceptional heart care, we have created designated Provider Care Teams.  These Care Teams include your primary Cardiologist (physician) and Advanced Practice Providers (APPs -  Physician Assistants and Nurse Practitioners) who all work together to provide you with the care you need, when you need it.  We recommend signing up for the patient portal called "MyChart".  Sign up information is provided on this After Visit Summary.  MyChart is used to connect with patients for Virtual Visits (Telemedicine).  Patients are able to view lab/test results, encounter notes, upcoming appointments, etc.  Non-urgent messages can be sent to your provider as well.   To learn more about what you can do with MyChart, go to  [email or web address].    Your next appointment:   3 month(s)  The format for your next appointment:   Either In Person or Virtual  Provider:   You will see Bentley Haralson, DO.          Adopting a Healthy Lifestyle.  Know what a healthy weight is for you (roughly BMI <25) and aim to maintain this   Aim for 7+ servings of fruits and vegetables daily   65-80+ fluid ounces of water or unsweet tea for healthy kidneys   Limit to max 1 drink of alcohol per day; avoid smoking/tobacco   Limit animal fats in diet for cholesterol and heart health - choose grass fed whenever available   Avoid highly processed foods, and foods high in saturated/trans fats   Aim for low stress - take time to unwind and care for your mental health   Aim for 150 min of moderate intensity exercise weekly for heart health, and weights twice weekly for bone health   Aim for 7-9 hours of sleep daily   When it comes to diets, agreement about the perfect plan isnt easy to find, even among the experts. Experts at the [employer] developed an idea known as the Healthy Eating Plate. Just imagine a plate divided into logical, healthy portions.   The emphasis is on diet quality:   Load up on vegetables and fruits - one-half of your plate: Aim for color and variety, and remember that potatoes dont count.   Go for whole grains - one-quarter of your plate: Whole wheat, barley, wheat berries, quinoa, oats, brown rice, and foods made with them. If you want pasta, go with whole wheat pasta.   Protein power - one-quarter of your plate: Fish, chicken, beans, and nuts are all healthy, versatile protein sources. Limit red meat.  The diet, however, does go beyond the plate, offering a few other suggestions.   Use healthy plant oils, such as olive, canola, soy, corn, sunflower and peanut. Check the labels, and avoid partially hydrogenated oil, which have unhealthy trans fats.   If youre thirsty,  drink water. Coffee and tea are good in moderation, but skip sugary drinks and limit milk and dairy products to one or two daily servings.   The type of carbohydrate in the diet is more important than the amount. Some sources of carbohydrates, such as vegetables, fruits, whole grains, and beans-are healthier than others.   Finally, stay active  Signed, Berniece Salines, DO  10/21/2019 8:11 PM    [facility]

## 2019-10-30 ENCOUNTER — Other Ambulatory Visit: Payer: Medicaid Other

## 2019-11-18 ENCOUNTER — Telehealth: Payer: Self-pay | Admitting: Cardiology

## 2019-11-18 ENCOUNTER — Other Ambulatory Visit: Payer: Medicaid Other

## 2019-11-18 NOTE — Telephone Encounter (Signed)
Spoke to the patient just now and she let me know that she wore the monitor for 5 days. She did state that she saw a flashing red light on the monitor while she was wearing it and she could not get it to go off. I advised the patient to call the number on the pamphlet that we gave her in the office and they would walk her through what needed to be done since the light is on.  No other issues or concerns were noted at this time.    Encouraged patient to call back with any questions or concerns.

## 2019-11-18 NOTE — Telephone Encounter (Signed)
Patient is calling to discuss sending monitor back. She states she had a reaction to the adhesive and monitor also stopped working while still in use. She states she is not sure whether or not she should send monitor back. Please advise.

## 2019-12-09 ENCOUNTER — Other Ambulatory Visit: Payer: Medicaid Other

## 2020-01-20 ENCOUNTER — Ambulatory Visit: Payer: Medicaid Other | Admitting: Cardiology

## 2021-06-07 IMAGING — US US PELVIS COMPLETE TRANSABD/TRANSVAG W DUPLEX
1 series · 13 of 25 positions shown · non-contrast
Comparison: CT 09/11/2019

CLINICAL DATA: Right lower quadrant pain

EXAM:
TRANSABDOMINAL AND TRANSVAGINAL ULTRASOUND OF PELVIS
DOPPLER ULTRASOUND OF OVARIES
TECHNIQUE: Both transabdominal and transvaginal ultrasound examinations of the
pelvis were performed. Transabdominal technique was performed for
global imaging of the pelvis including uterus, ovaries, adnexal
regions, and pelvic cul-de-sac.
It was necessary to proceed with endovaginal exam following the
transabdominal exam to visualize the ovaries. Color and duplex
Doppler ultrasound was utilized to evaluate blood flow to the
ovaries.

[Series 1: us pelvis complete transabd/transvag w duplex · 107 acquisitions, 13 frames shown]
[im 1/107]
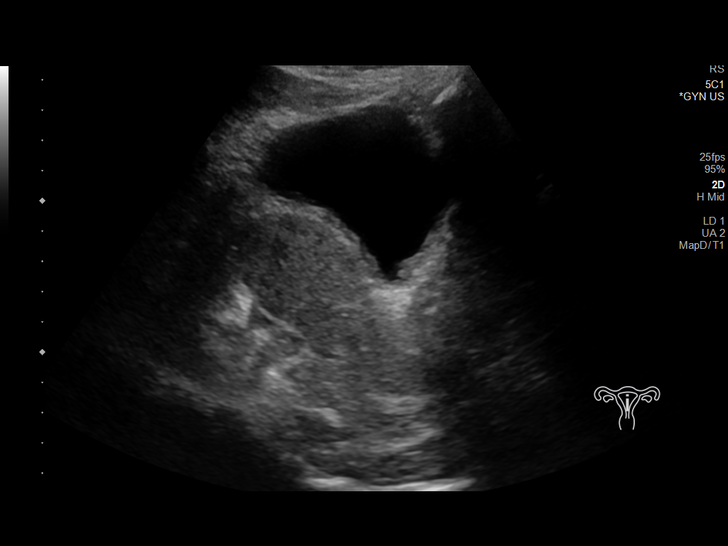
[im 9/107]
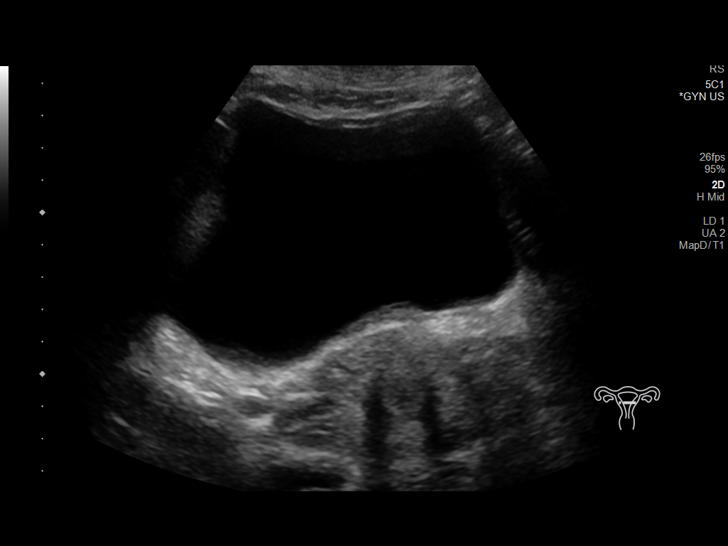
[im 18/107]
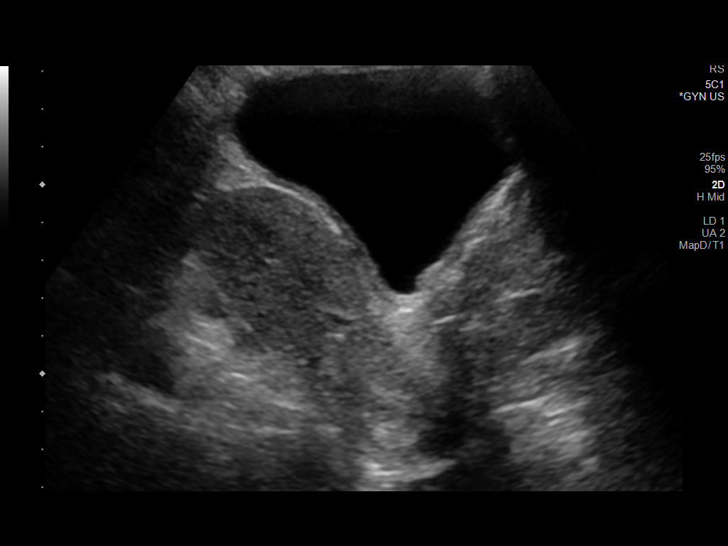
[im 27/107]
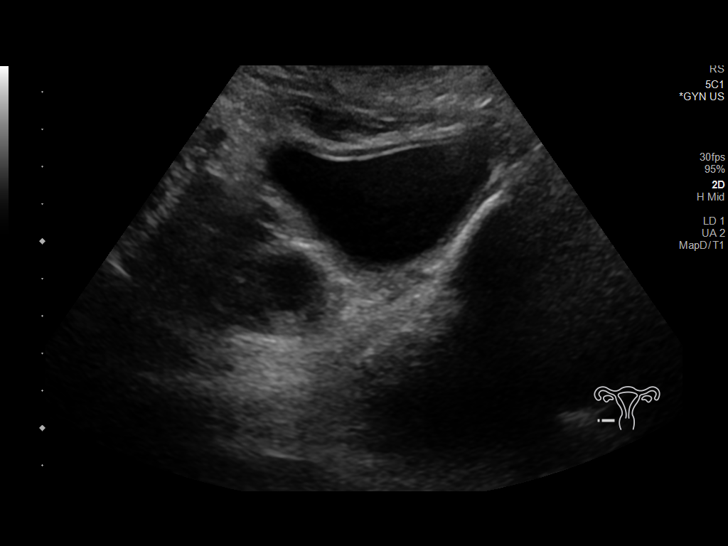
[im 36/107]
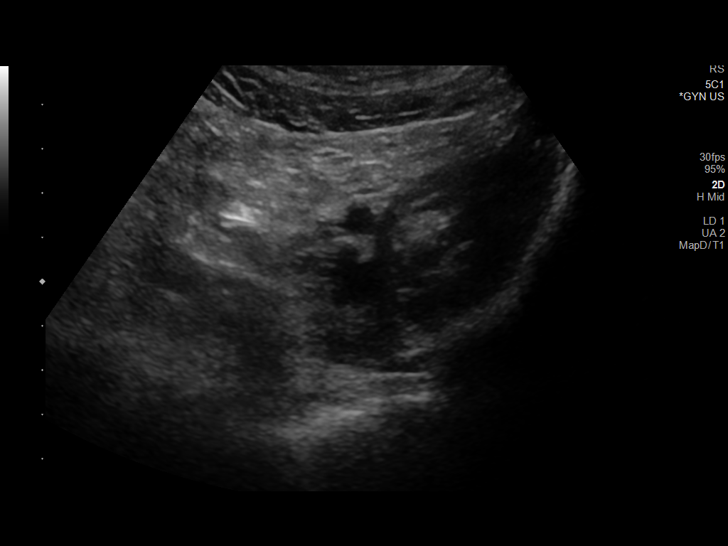
[im 45/107]
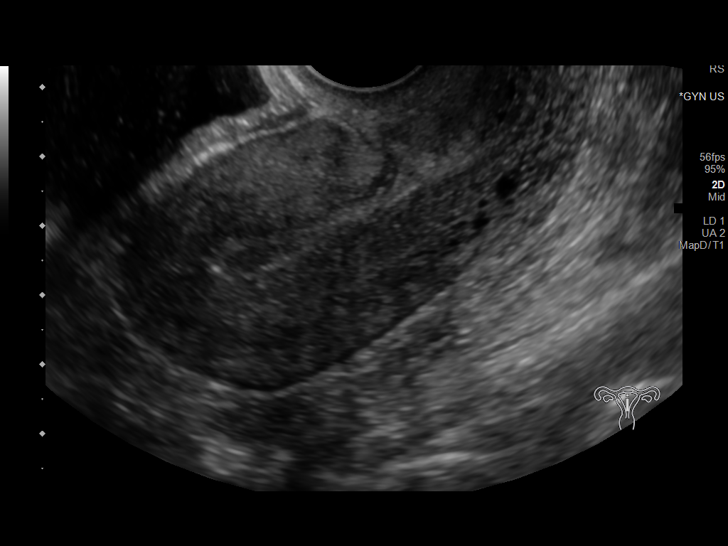
[im 54/107]
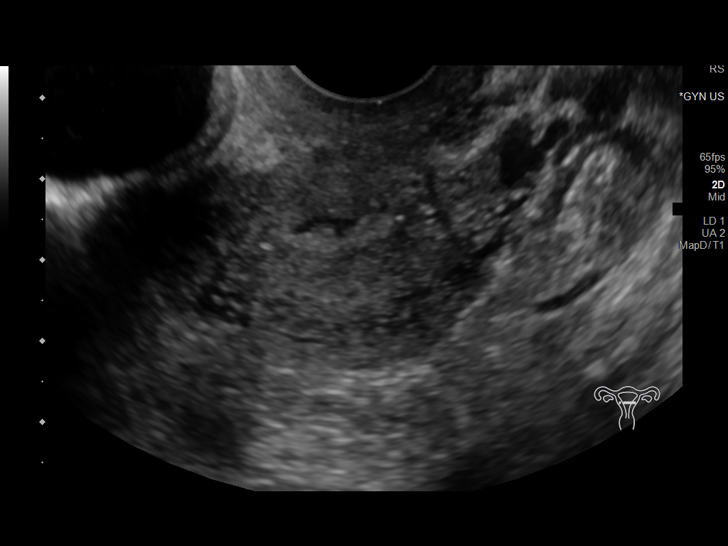
[im 62/107]
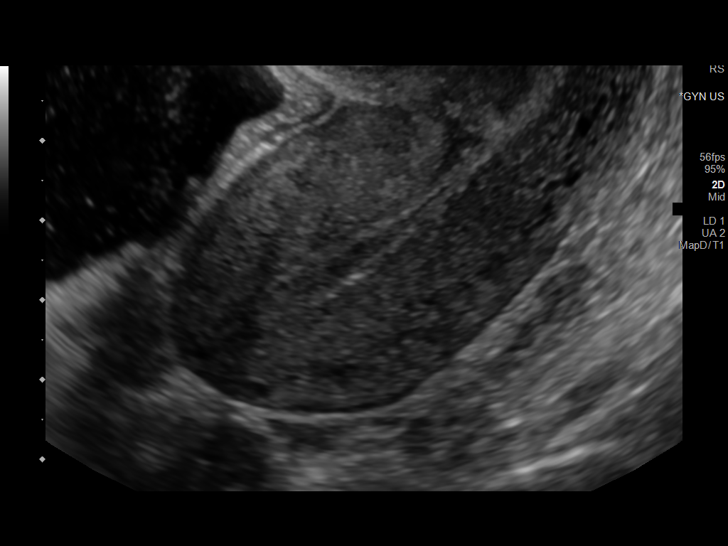
[im 71/107]
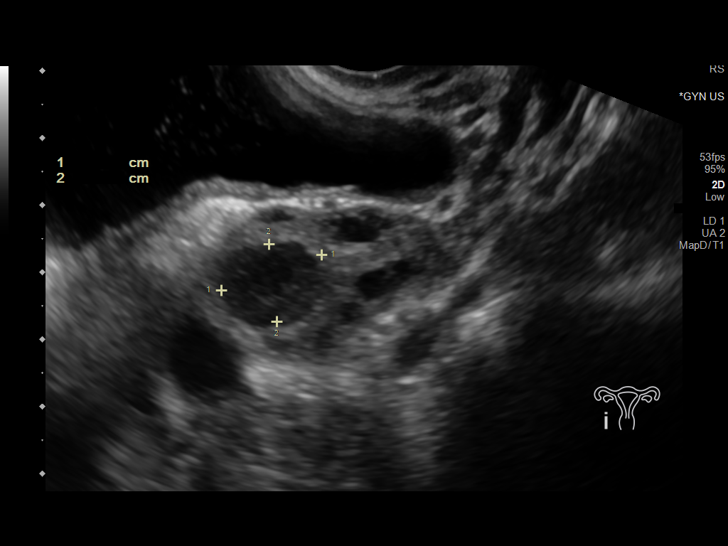
[im 80/107]
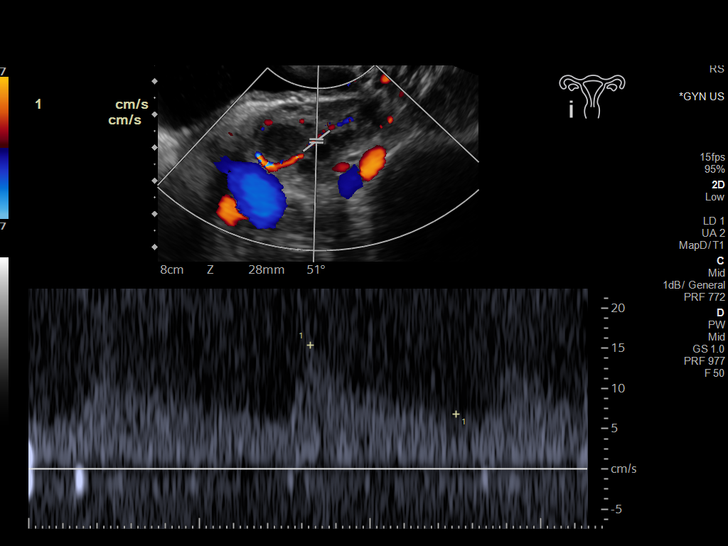
[im 89/107]
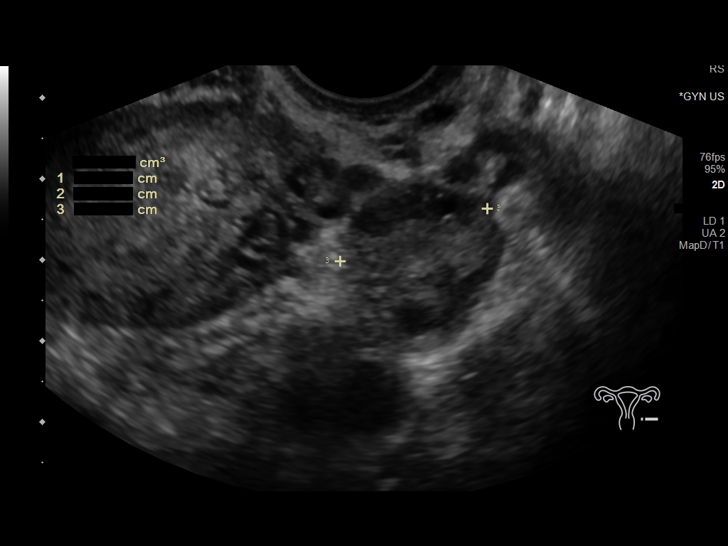
[im 98/107]
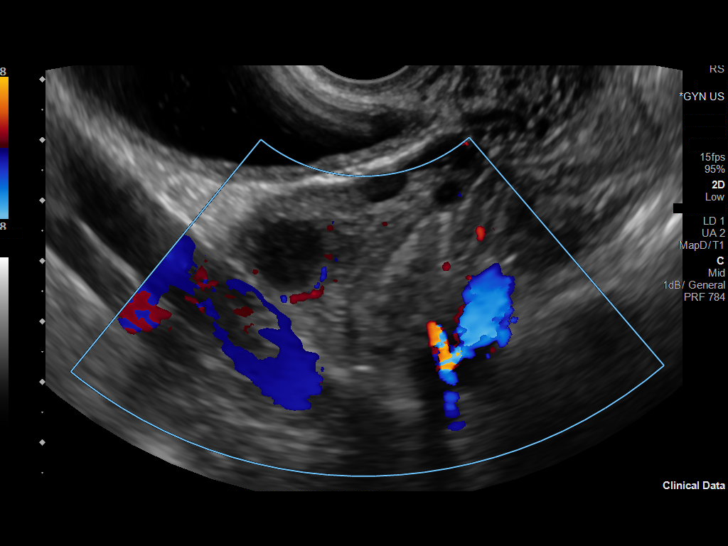
[im 107/107]
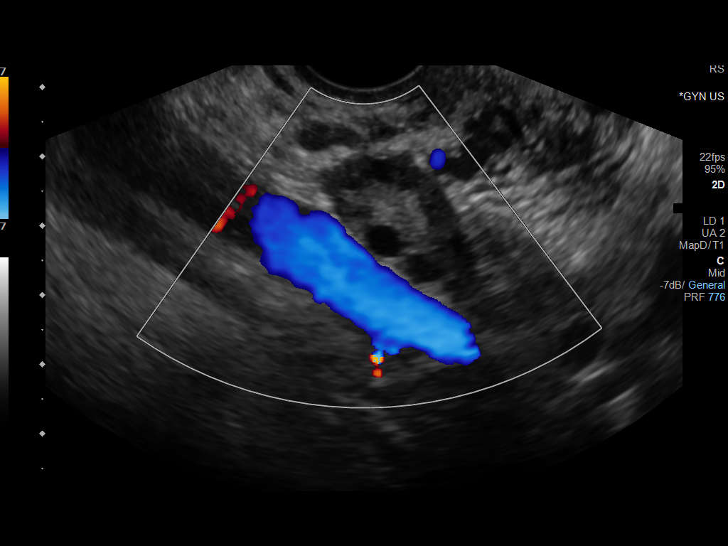

[13 of 25 positions shown; findings below may reference images not displayed]

FINDINGS: Uterus

Measurements: 8.4 x 4 x 4.8 cm = volume: 83.3 mL. No fibroids or
other mass visualized. Tiny fluid in the cervix.

Endometrium

Thickness: 7 mm.  No focal abnormality visualized.

Right ovary

Measurements: 3.9 x 2.5 x 3.2 cm = volume: 16 point mL. 1.6 cm
probable hemorrhagic follicle.

Left ovary

Measurements: 3.4 x 1.6 x 1.9 cm = volume: 5.3 mL. Normal
appearance/no adnexal mass.

Pulsed Doppler evaluation of both ovaries demonstrates normal
low-resistance arterial and venous waveforms.

Other findings

No abnormal free fluid.
IMPRESSION: Negative for ovarian torsion or suspicious ovarian mass lesion.
Essentially negative pelvic ultrasound.

## 2021-06-07 IMAGING — CT CT ABD-PELV W/ CM
2 of 4 series · 15 of 46 positions shown, 17 images · IV contrast (Omni 300)
Comparison: 07/13/2019 CT abdomen/pelvis.

CLINICAL DATA: Generalized acute mid abdominal pain. Neutropenia.

EXAM:
CT ABDOMEN AND PELVIS WITH CONTRAST
TECHNIQUE: Multidetector CT imaging of the abdomen and pelvis was performed
using the standard protocol following bolus administration of
intravenous contrast.
CONTRAST:  100mL OMNIPAQUE IOHEXOL 300 MG/ML  SOLN

[Series 3: a/p w/ 5mm · axial · 0.60mm/px · z∈[+830,+1240]mm · 12 of 94 slices shown, 14 images]
[im 8/94  soft-tissue]
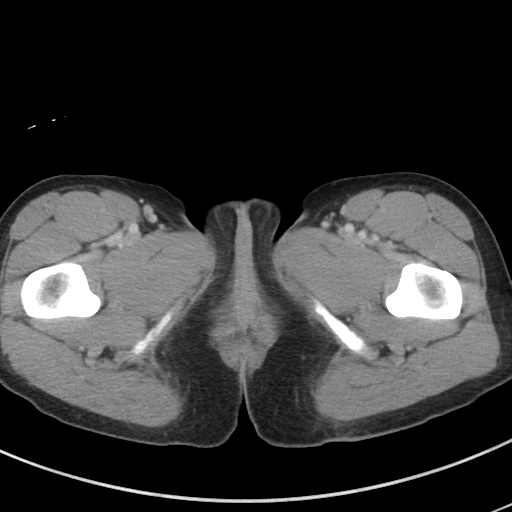
[im 8/94  bone]
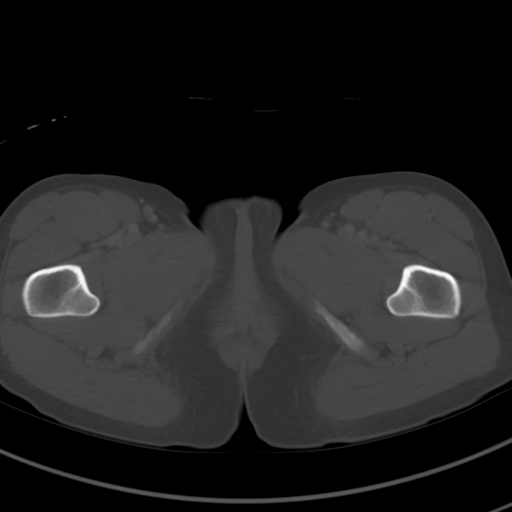
[im 15/94  soft-tissue]
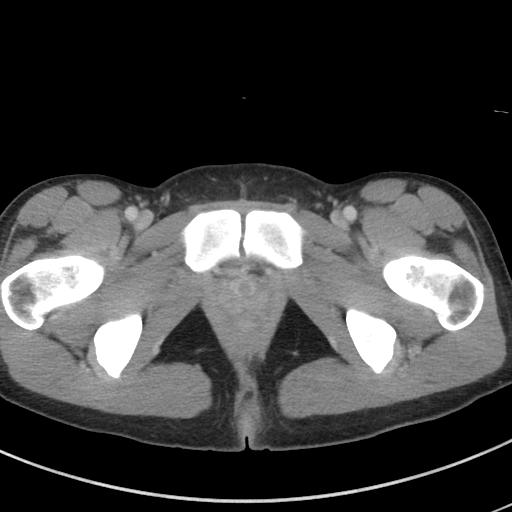
[im 23/94  soft-tissue]
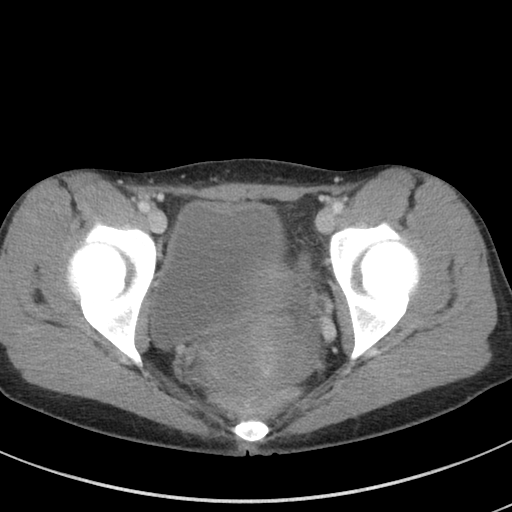
[im 30/94  soft-tissue]
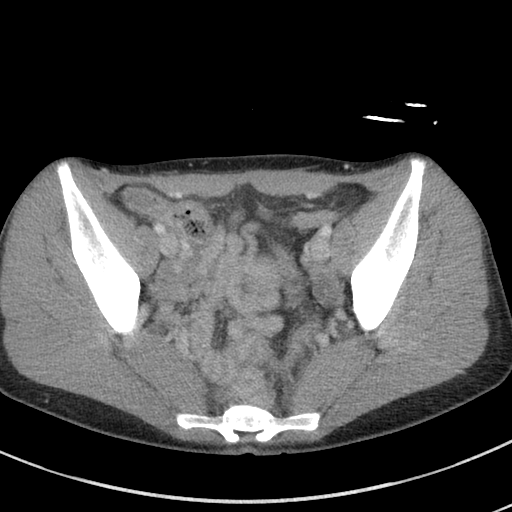
[im 38/94  soft-tissue]
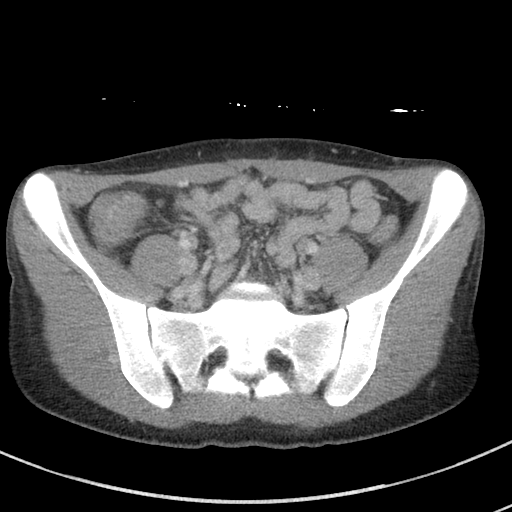
[im 45/94  soft-tissue]
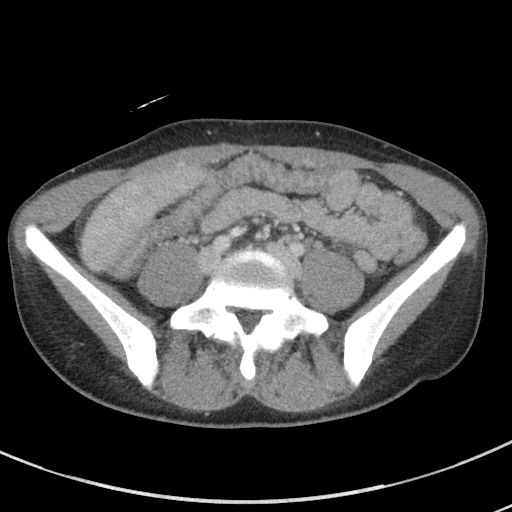
[im 53/94  soft-tissue]
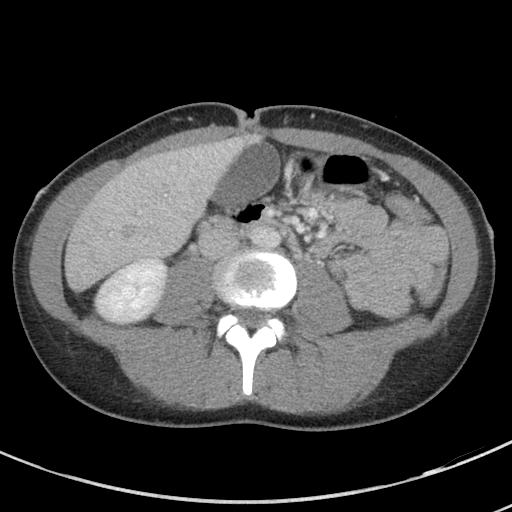
[im 60/94  soft-tissue]
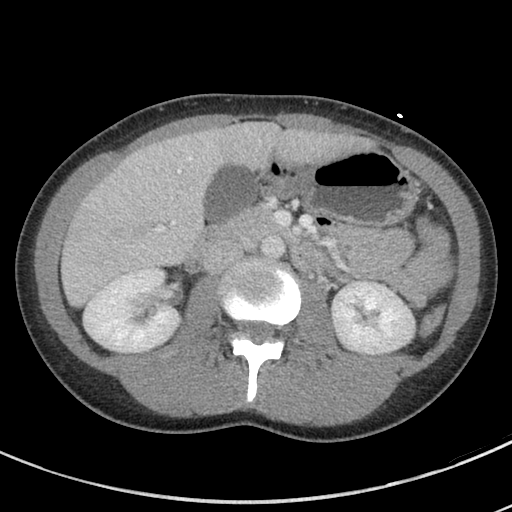
[im 67/94  soft-tissue]
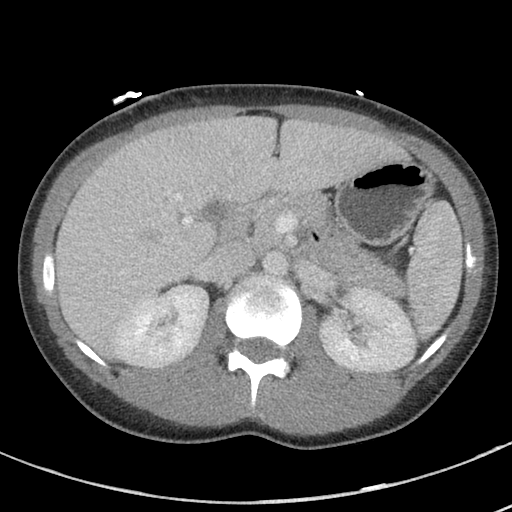
[im 67/94  bone]
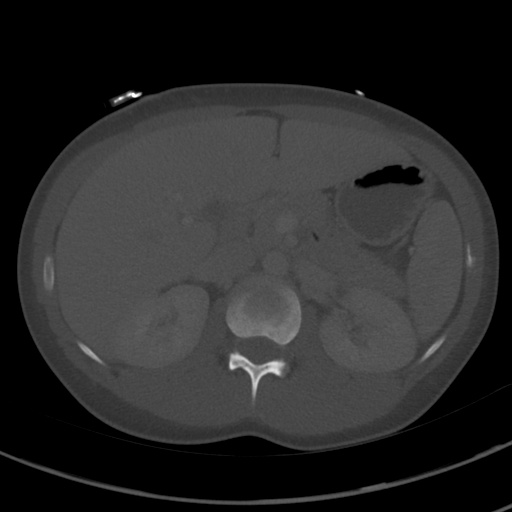
[im 75/94  soft-tissue]
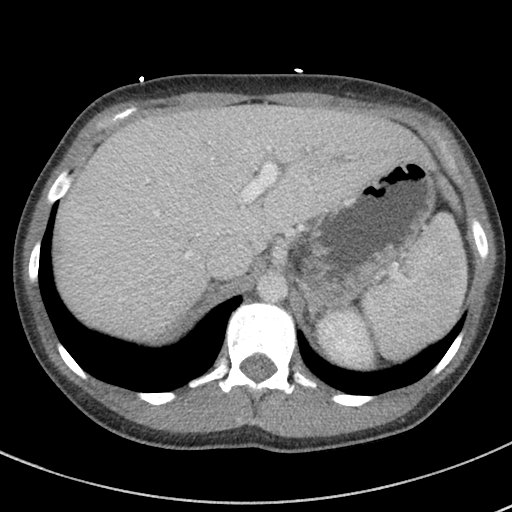
[im 82/94  soft-tissue]
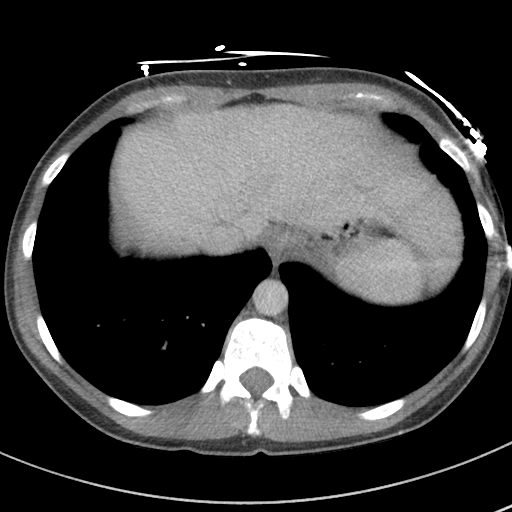
[im 90/94  soft-tissue]
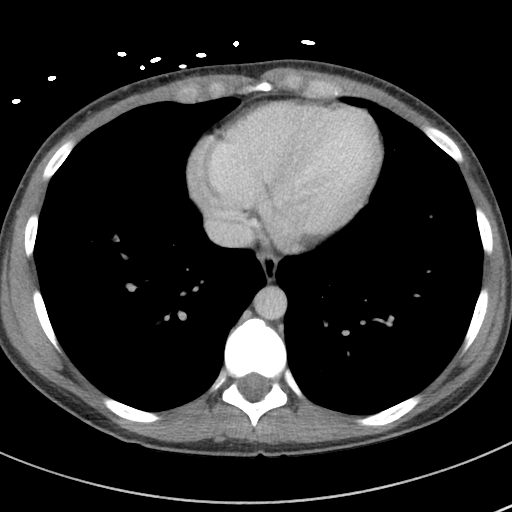

[Series 6: a/p w/ cor · coronal · 0.65mm/px · 3 of 106 slices shown]
[im 36/106  soft-tissue]
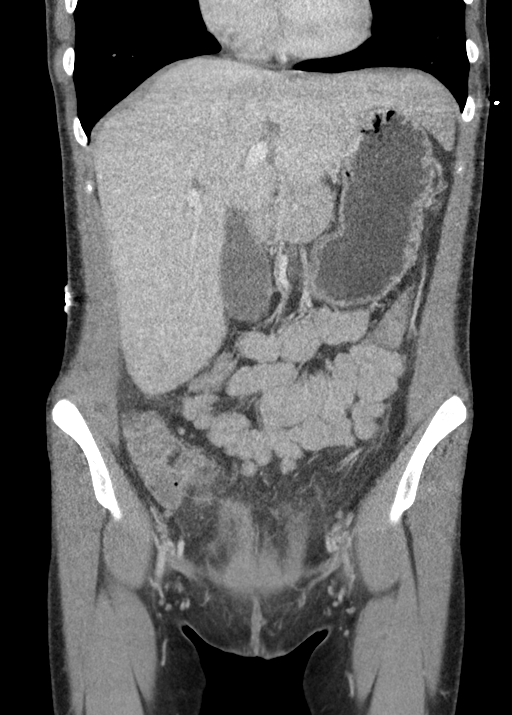
[im 47/106  soft-tissue]
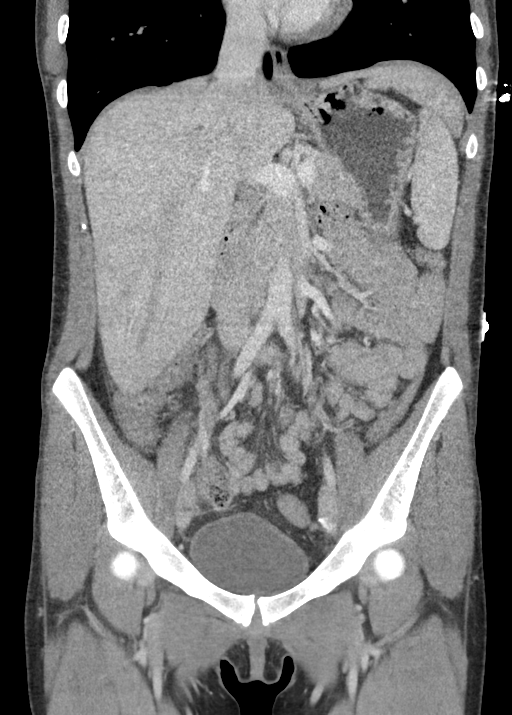
[im 59/106  soft-tissue]
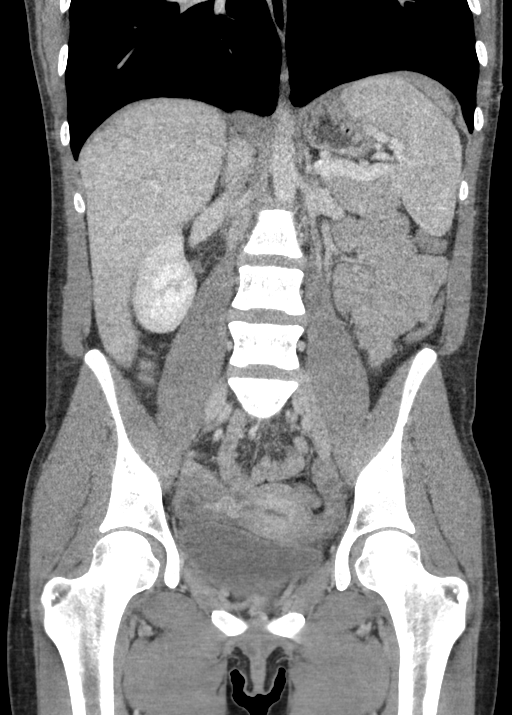

[15 of 46 positions shown; findings below may reference images not displayed]

FINDINGS: Lower chest: Mild patchy ground-glass opacities at right greater
than left lung bases.

Hepatobiliary: Persistent mild hepatomegaly, unchanged. No liver
masses. No definite liver surface irregularity. Normal gallbladder
with no radiopaque cholelithiasis. No biliary ductal dilatation.

Pancreas: Normal, with no mass or duct dilation.

Spleen: Normal size. No mass.

Adrenals/Urinary Tract: Normal adrenals. Normal kidneys with no
hydronephrosis and no renal mass. Normal bladder.

Stomach/Bowel: Normal non-distended stomach. Collapsed small bowel
loops. No dilated small bowel loops. No small bowel wall thickening.
Normal appendix. Collapsed large bowel. Apparent mild thickening of
the ascending colonic wall. Otherwise normal large bowel wall
thickness with no colonic diverticulosis.

Vascular/Lymphatic: Normal caliber abdominal aorta. Patent portal,
splenic, hepatic and renal veins. No pathologically enlarged lymph
nodes in the abdomen or pelvis.

Reproductive: Grossly normal uterus. Bilateral tubal ligation clips
are in place. No adnexal mass.

Other: No pneumoperitoneum, ascites or focal fluid collection.

Musculoskeletal: No aggressive appearing focal osseous lesions. Mild
degenerative disc disease in the lower thoracic spine.
IMPRESSION: 1. Apparent mild thickening of the ascending colonic wall, which
could be due to underdistention, with a nonspecific mild colitis or
noninflammatory edema such as due to hypoproteinemia not excluded.
No evidence of bowel obstruction. Normal appendix.
2. Nonspecific mild hepatomegaly is unchanged since recent CT.
Normal size spleen. No abdominopelvic adenopathy.
3. Mild patchy ground-glass opacities at the right greater than left
lung bases, with a broad differential, probably
inflammatory/atypical pneumonia.

## 2023-04-06 ENCOUNTER — Emergency Department (HOSPITAL_COMMUNITY)
Admission: EM | Admit: 2023-04-06 | Discharge: 2023-04-06 | Disposition: A | Discharge status: Home / Self Care | Payer: Medicaid Other | Attending: Emergency Medicine | Admitting: Emergency Medicine

## 2023-04-06 ENCOUNTER — Other Ambulatory Visit: Payer: Self-pay

## 2023-04-06 ENCOUNTER — Encounter (HOSPITAL_COMMUNITY): Payer: Self-pay | Admitting: Emergency Medicine

## 2023-04-06 ENCOUNTER — Emergency Department (HOSPITAL_COMMUNITY): Payer: Medicaid Other

## 2023-04-06 DIAGNOSIS — J45909 Unspecified asthma, uncomplicated: Secondary | ICD-10-CM | POA: Insufficient documentation

## 2023-04-06 DIAGNOSIS — Z87891 Personal history of nicotine dependence: Secondary | ICD-10-CM | POA: Diagnosis not present

## 2023-04-06 DIAGNOSIS — T185XXA Foreign body in anus and rectum, initial encounter: Secondary | ICD-10-CM | POA: Diagnosis present

## 2023-04-06 DIAGNOSIS — D72829 Elevated white blood cell count, unspecified: Secondary | ICD-10-CM | POA: Insufficient documentation

## 2023-04-06 DIAGNOSIS — W44G2XA Combination metal and plastic toy and toy part entering into or through natural orifice, initial encounter: Secondary | ICD-10-CM | POA: Diagnosis not present

## 2023-04-06 LAB — COMPREHENSIVE METABOLIC PANEL
ALT: 11 U/L (ref 0–44)
AST: 17 U/L (ref 15–41)
Albumin: 4.3 g/dL (ref 3.5–5.0)
Alkaline Phosphatase: 87 U/L (ref 38–126)
Anion gap: 15 (ref 5–15)
BUN: 5 mg/dL — ABNORMAL LOW (ref 6–20)
CO2: 18 mmol/L — ABNORMAL LOW (ref 22–32)
Calcium: 9.3 mg/dL (ref 8.9–10.3)
Chloride: 106 mmol/L (ref 98–111)
Creatinine, Ser: 0.73 mg/dL (ref 0.44–1.00)
GFR, Estimated: >60 mL/min (ref >60)
Glucose, Bld: 84 mg/dL (ref 70–99)
Potassium: 3.7 mmol/L (ref 3.5–5.1)
Sodium: 139 mmol/L (ref 135–145)
Total Bilirubin: 0.8 mg/dL (ref 0.3–1.2)
Total Protein: 7.7 g/dL (ref 6.5–8.1)

## 2023-04-06 LAB — CBC
HCT: 48.8 % — ABNORMAL HIGH (ref 36.0–46.0)
Hemoglobin: 15.3 g/dL — ABNORMAL HIGH (ref 12.0–15.0)
MCH: 27.7 pg (ref 26.0–34.0)
MCHC: 31.4 g/dL (ref 30.0–36.0)
MCV: 88.4 fL (ref 80.0–100.0)
Platelets: 605 10*3/uL — ABNORMAL HIGH (ref 150–400)
RBC: 5.52 MIL/uL — ABNORMAL HIGH (ref 3.87–5.11)
RDW: 15.1 % (ref 11.5–15.5)
WBC: 24.7 10*3/uL — ABNORMAL HIGH (ref 4.0–10.5)
nRBC: 0 % (ref 0.0–0.2)

## 2023-04-06 LAB — HCG, SERUM, QUALITATIVE: Preg, Serum: NEGATIVE

## 2023-04-06 MED ORDER — ONDANSETRON HCL 4 MG/2ML IJ SOLN
4.0000 mg | Freq: Once | INTRAMUSCULAR | Status: AC
  Administered 2023-04-06: 4 mg via INTRAVENOUS
  Filled 2023-04-06: qty 2

## 2023-04-06 MED ORDER — HYDROMORPHONE HCL 1 MG/ML IJ SOLN
0.5000 mg | Freq: Once | INTRAMUSCULAR | Status: AC
  Administered 2023-04-06: 0.5 mg via INTRAVENOUS
  Filled 2023-04-06: qty 1

## 2023-04-06 MED ORDER — LORAZEPAM 2 MG/ML IJ SOLN
2.0000 mg | Freq: Once | INTRAMUSCULAR | Status: AC
  Administered 2023-04-06: 2 mg via INTRAVENOUS
  Filled 2023-04-06: qty 1

## 2023-04-06 NOTE — ED Notes (Signed)
Assisted EDP with manually dislodging plug from rectal cavity. Pt tolerated well.

## 2023-04-06 NOTE — ED Provider Notes (Signed)
[facility]PT [facility] Emergency Department Provider Note MRN:  [id number]  Arrival date & time: 04/06/23     Chief Complaint   Foreign Body in Rectum   History of Present Illness   Ashley Sexton is a 27 y.o. year-old female with no pertinent presenting to the ED with chief complaint of foreign body in rectum.  Metallic foreign body used as 630.  Described as a butt plug.  Became fully lodged in the rectum, could not get it out.  Feels like it has rotated.  Having considerable rectal pain.  Feels embarrassed.  Review of Systems  A thorough review of systems was obtained and all systems are negative except as noted in the HPI and PMH.   Patient's Health History    Past Medical History:  Diagnosis Date   Anemia    Anxiety    Asthma    Bradycardia    Kidney stone     Past Surgical History:  Procedure Laterality Date   CESAREAN SECTION     X2   TUBAL LIGATION  09/2018   URETERAL STENT PLACEMENT     For kidney stone    Family History  Problem Relation Age of Onset   Heart disease Mother    Hypertension Mother    Hypercholesterolemia Mother    Heart disease Father    Hypertension Father    Hypercholesterolemia Father     Social History   Socioeconomic History   Marital status: Single    Spouse name: Not on file   Number of children: Not on file   Years of education: Not on file   Highest education level: Not on file  Occupational History   Not on file  Tobacco Use   Smoking status: Former    Types: Cigarettes   Smokeless tobacco: Never  Substance and Sexual Activity   Alcohol use: Not Currently   Drug use: Yes    Frequency: 1.0 times per week    Types: Marijuana   Sexual activity: Yes  Other Topics Concern   Not on file  Social History Narrative   Not on file   Social Determinants of Health   Financial Resource Strain: Not on file  Food Insecurity: Not on file  Transportation Needs: Not on file  Physical Activity: Not on file  Stress:  Not on file  Social Connections: Not on file  Intimate Partner Violence: Not on file     Physical Exam   Vitals:   04/06/23 0151 04/06/23 0152  BP: (!) 135/94   Pulse: (!) 102 (!) 120  Resp: 16   Temp: 98.6 F (37 C)   SpO2: 98% 92%    CONSTITUTIONAL: Well-appearing, moderate distress due to discomfort NEURO/PSYCH:  Alert and oriented x 3, no focal deficits EYES:  eyes equal and reactive ENT/NECK:  no LAD, no JVD CARDIO: Regular rate, well-perfused, normal S1 and S2 PULM:  CTAB no wheezing or rhonchi GI/GU:  non-distended, non-tender MSK/SPINE:  No gross deformities, no edema SKIN:  no rash, atraumatic   *Additional and/or pertinent findings included in MDM below  Diagnostic and Interventional Summary    EKG Interpretation Date/Time:    Ventricular Rate:    PR Interval:    QRS Duration:    QT Interval:    QTC Calculation:   R Axis:      Text Interpretation:         Labs Reviewed  CBC - Abnormal; Notable for the following components:      Result  Value   WBC 24.7 (*)    RBC 5.52 (*)    Hemoglobin 15.3 (*)    HCT 48.8 (*)    Platelets 605 (*)    All other components within normal limits  COMPREHENSIVE METABOLIC PANEL - Abnormal; Notable for the following components:   CO2 18 (*)    BUN 5 (*)    All other components within normal limits  HCG, SERUM, QUALITATIVE  PREGNANCY, URINE    DG Pelvis Portable  Final Result      Medications  HYDROmorphone (DILAUDID) injection 0.5 mg (0.5 mg Intravenous Given 04/06/23 0253)  ondansetron (ZOFRAN) injection 4 mg (4 mg Intravenous Given 04/06/23 0327)  HYDROmorphone (DILAUDID) injection 0.5 mg (0.5 mg Intravenous Given 04/06/23 0327)  LORazepam (ATIVAN) injection 2 mg (2 mg Intravenous Given 04/06/23 0430)     Procedures  /  Critical Care .Foreign Body Removal  Date/Time: 04/06/2023 4:46 AM  Performed by: Sabas Sous, MD Authorized by: Sabas Sous, MD  Consent: Verbal consent obtained. Risks and  benefits: risks, benefits and alternatives were discussed Consent given by: patient Patient understanding: patient states understanding of the procedure being performed Imaging studies: imaging studies available Patient identity confirmed: verbally with patient Time out: Immediately prior to procedure a "time out" was called to verify the correct patient, procedure, equipment, support staff and site/side marked as required. Body area: rectum  Sedation: Patient sedated: no  Patient cooperative: yes 1 objects recovered. Objects recovered: sex toy Post-procedure assessment: foreign body removed Patient tolerance: patient tolerated the procedure well with no immediate complications    ED Course and Medical Decision Making  Initial Impression and Ddx Rectal foreign body, x-ray revealing a butt plug this seems to have rotated.  The orientation looks like it will be quite challenging to remove with the instrumentation available here in the emergency department, especially given that it is metallic.  General surgery consulted for recommendations.  Past medical/surgical history that increases complexity of ED encounter: None  Interpretation of Diagnostics I personally reviewed the laboratory assessment and my interpretation is as follows: Prominent leukocytosis likely as a stress response  Pelvis x-ray confirms metallic foreign body likely in the rectum.  Patient Reassessment and Ultimate Disposition/Management     While awaiting general surgery consultation I decided to perform a rectal exam to get a better sense of the foreign body in question.  The orientation and shape of the object was a bit awkward to deal with but with some persistence in the assistance of anxiolysis the foreign body was removed without issue.  Patient is appropriate for discharge.  Patient management required discussion with the following services or consulting groups:  General/Trauma Surgery  Complexity of  Problems Addressed Acute illness or injury that poses threat of life of bodily function  Additional Data Reviewed and Analyzed Further history obtained from: Further history from spouse/family member  Additional Factors Impacting ED Encounter Risk Minor Procedures  Elmer Sow. Pilar Plate, MD [facility] Emergency Medicine [facility] [email or web address]  Final Clinical Impressions(s) / ED Diagnoses     ICD-10-CM   1. Foreign body of rectum, initial encounter  [email or web address]       ED Discharge Orders     None        Discharge Instructions Discussed with and Provided to Patient:     Discharge Instructions      You were evaluated in the Emergency Department and after careful evaluation, we did not find any emergent condition requiring admission  or further testing in the hospital.  Your exam/testing today was overall reassuring.  Please return to the Emergency Department if you experience any worsening of your condition.  Thank you for allowing [place] to be a part of your care.        Sabas Sous, MD 04/06/23 [phone]

## 2023-04-06 NOTE — ED Triage Notes (Signed)
Pt presents for sex toy stuck in her rectum. States that she believes that the item is now backwards. Placed with her consent. Endorses etOH tonight.

## 2023-04-06 NOTE — Discharge Instructions (Signed)
You were evaluated in the Emergency Department and after careful evaluation, we did not find any emergent condition requiring admission or further testing in the hospital.  Your exam/testing today was overall reassuring.  Please return to the Emergency Department if you experience any worsening of your condition.  Thank you for allowing [place] to be a part of your care.
